# Patient Record
Sex: Male | Born: 1980 | Race: White | Hispanic: No | Marital: Single | Smoking: Never smoker
Health system: Southern US, Community
[De-identification: ages and names within clinical notes are randomized; demographics above are authoritative.]

---

## 2003-09-14 ENCOUNTER — Emergency Department (HOSPITAL_COMMUNITY): Admission: AD | Admit: 2003-09-14 | Discharge: 2003-09-14 | Payer: Self-pay | Admitting: Family Medicine

## 2009-05-19 ENCOUNTER — Emergency Department (HOSPITAL_COMMUNITY): Admission: EM | Admit: 2009-05-19 | Discharge: 2009-05-19 | Payer: Self-pay | Admitting: Family Medicine

## 2009-05-21 ENCOUNTER — Emergency Department (HOSPITAL_COMMUNITY): Admission: EM | Admit: 2009-05-21 | Discharge: 2009-05-21 | Payer: Self-pay | Admitting: Family Medicine

## 2009-06-02 ENCOUNTER — Emergency Department (HOSPITAL_COMMUNITY): Admission: EM | Admit: 2009-06-02 | Discharge: 2009-06-02 | Payer: Self-pay | Admitting: Family Medicine

## 2009-11-12 ENCOUNTER — Emergency Department (HOSPITAL_COMMUNITY): Admission: EM | Admit: 2009-11-12 | Discharge: 2009-11-12 | Payer: Self-pay | Admitting: Emergency Medicine
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
## 2011-01-29 ENCOUNTER — Inpatient Hospital Stay (INDEPENDENT_AMBULATORY_CARE_PROVIDER_SITE_OTHER)
Admission: RE | Admit: 2011-01-29 | Discharge: 2011-01-29 | Disposition: A | Discharge status: Home / Self Care | Payer: Self-pay | Source: Ambulatory Visit | Attending: Family Medicine | Admitting: Family Medicine

## 2011-01-29 DIAGNOSIS — J069 Acute upper respiratory infection, unspecified: Secondary | ICD-10-CM

## 2023-04-24 ENCOUNTER — Emergency Department (HOSPITAL_BASED_OUTPATIENT_CLINIC_OR_DEPARTMENT_OTHER)
Admission: EM | Admit: 2023-04-24 | Discharge: 2023-04-24 | Disposition: A | Discharge status: Home / Self Care | Payer: No Typology Code available for payment source | Attending: Emergency Medicine | Admitting: Emergency Medicine

## 2023-04-24 ENCOUNTER — Emergency Department (HOSPITAL_BASED_OUTPATIENT_CLINIC_OR_DEPARTMENT_OTHER): Payer: Self-pay

## 2023-04-24 ENCOUNTER — Encounter (HOSPITAL_BASED_OUTPATIENT_CLINIC_OR_DEPARTMENT_OTHER): Payer: Self-pay | Admitting: Emergency Medicine

## 2023-04-24 ENCOUNTER — Other Ambulatory Visit: Payer: Self-pay

## 2023-04-24 DIAGNOSIS — S62640A Nondisplaced fracture of proximal phalanx of right index finger, initial encounter for closed fracture: Secondary | ICD-10-CM | POA: Insufficient documentation

## 2023-04-24 DIAGNOSIS — Y92410 Unspecified street and highway as the place of occurrence of the external cause: Secondary | ICD-10-CM | POA: Insufficient documentation

## 2023-04-24 DIAGNOSIS — S60221A Contusion of right hand, initial encounter: Secondary | ICD-10-CM

## 2023-04-24 DIAGNOSIS — S50812A Abrasion of left forearm, initial encounter: Secondary | ICD-10-CM | POA: Insufficient documentation

## 2023-04-24 DIAGNOSIS — M79641 Pain in right hand: Secondary | ICD-10-CM | POA: Diagnosis present

## 2023-04-24 DIAGNOSIS — S62600A Fracture of unspecified phalanx of right index finger, initial encounter for closed fracture: Secondary | ICD-10-CM
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
## 2023-04-24 NOTE — ED Notes (Signed)
ED Provider at bedside. 
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
## 2023-04-24 NOTE — Discharge Instructions (Addendum)
Follow-up with orthopedics Dr. Aundria Rud in 1-2 weeks.  Please return to the emergency department if symptoms worse.  Recommend Tylenol, ibuprofen, ice.  Use splint for immobilization, ok to take off for shower.
